# Patient Record
Sex: Female | Born: 1980 | Race: Black or African American | Hispanic: No | Marital: Single | Smoking: Never smoker
Health system: Southern US, Community
[De-identification: ages and names within clinical notes are randomized; demographics above are authoritative.]

---

## 2011-03-14 ENCOUNTER — Encounter: Payer: Self-pay | Admitting: *Deleted

## 2011-03-14 ENCOUNTER — Emergency Department (HOSPITAL_BASED_OUTPATIENT_CLINIC_OR_DEPARTMENT_OTHER)
Admission: EM | Admit: 2011-03-14 | Discharge: 2011-03-14 | Disposition: A | Discharge status: Home / Self Care | Payer: Self-pay | Attending: Emergency Medicine | Admitting: Emergency Medicine

## 2011-03-14 DIAGNOSIS — L089 Local infection of the skin and subcutaneous tissue, unspecified: Secondary | ICD-10-CM | POA: Insufficient documentation

## 2011-03-14 MED ORDER — HYDROCODONE-ACETAMINOPHEN 5-325 MG PO TABS: 2.0000 | ORAL_TABLET | ORAL | Status: AC | PRN

## 2011-03-14 MED ORDER — HYDROCODONE-ACETAMINOPHEN 5-325 MG PO TABS: 2.0000 | ORAL_TABLET | ORAL | Status: DC | PRN

## 2011-03-14 MED ORDER — SULFAMETHOXAZOLE-TRIMETHOPRIM 800-160 MG PO TABS: 1.0000 | ORAL_TABLET | Freq: Two times a day (BID) | ORAL | Status: DC

## 2011-03-14 MED ORDER — SULFAMETHOXAZOLE-TRIMETHOPRIM 800-160 MG PO TABS: 1.0000 | ORAL_TABLET | Freq: Two times a day (BID) | ORAL | Status: AC

## 2011-03-14 NOTE — ED Notes (Signed)
Pt states she has had recurrent boils appear under her left arm

## 2011-03-14 NOTE — ED Provider Notes (Signed)
History     CSN: [id number] Arrival date & time: 03/14/2011  6:23 PM  Chief Complaint  Patient presents with  . Recurrent Skin Infections   Patient is a 30 y.o. female presenting with abscess. The history is provided by the patient. No language interpreter was used.  Abscess  This is a new problem. The current episode started today. The onset was gradual. The problem occurs rarely. The abscess is present on the left arm. The problem is moderate. The abscess is characterized by itchiness and redness. The abscess first occurred at home. Pertinent negatives include no fever.  Pt complains of swollen painful areas under left axilla History reviewed. No pertinent past medical history.  History reviewed. No pertinent past surgical history.  History reviewed. No pertinent family history.  History  Substance Use Topics  . Smoking status: Never Smoker   . Smokeless tobacco: Not on file  . Alcohol Use: Yes    OB History    Grav Para Term Preterm Abortions TAB SAB Ect Mult Living                  Review of Systems  Constitutional: Negative for fever.  All other systems reviewed and are negative.    Physical Exam  BP 131/77  Pulse 80  Temp(Src) 100.7 F (38.2 C) (Oral)  Resp 18  Ht 5' (1.524 m)  Wt 126 lb (57.153 kg)  BMI 24.61 kg/m2  SpO2 99%  Physical Exam  Nursing note and vitals reviewed. Constitutional: She appears well-developed.  HENT:  Head: Normocephalic.  Eyes: Pupils are equal, round, and reactive to light.  Neck: Normal range of motion.  Cardiovascular: Normal rate.   Pulmonary/Chest: Effort normal.  Abdominal: Soft.  Musculoskeletal: Normal range of motion.  Neurological: She is alert.  Skin: Rash noted. There is erythema.    ED Course  Procedures  MDM Tender areas left axilla,  (probable mixture of lymph nodes and abscessed areas,  I will treat with antibiotics,      Langston Masker, [place] 03/14/11 1933

## 2011-03-15 NOTE — ED Provider Notes (Signed)
Medical screening examination/treatment/procedure(s) were performed by non-physician practitioner and as supervising physician I was immediately available for consultation/collaboration.   Leigh-Ann Maxine Huynh, MD 03/15/11 0144 

## 2013-06-21 ENCOUNTER — Emergency Department (HOSPITAL_BASED_OUTPATIENT_CLINIC_OR_DEPARTMENT_OTHER)
Admission: EM | Admit: 2013-06-21 | Discharge: 2013-06-22 | Disposition: A | Discharge status: Home / Self Care | Payer: No Typology Code available for payment source | Attending: Emergency Medicine | Admitting: Emergency Medicine

## 2013-06-21 ENCOUNTER — Encounter (HOSPITAL_BASED_OUTPATIENT_CLINIC_OR_DEPARTMENT_OTHER): Payer: Self-pay | Admitting: Emergency Medicine

## 2013-06-21 DIAGNOSIS — R1110 Vomiting, unspecified: Secondary | ICD-10-CM | POA: Insufficient documentation

## 2013-06-21 DIAGNOSIS — R197 Diarrhea, unspecified: Secondary | ICD-10-CM | POA: Insufficient documentation

## 2013-06-21 DIAGNOSIS — Z3202 Encounter for pregnancy test, result negative: Secondary | ICD-10-CM | POA: Insufficient documentation

## 2013-06-21 DIAGNOSIS — N390 Urinary tract infection, site not specified: Secondary | ICD-10-CM | POA: Insufficient documentation

## 2013-06-21 NOTE — ED Notes (Signed)
Vomiting onset this pm 

## 2013-06-22 LAB — URINALYSIS, ROUTINE W REFLEX MICROSCOPIC
Nitrite: NEGATIVE
Protein, ur: NEGATIVE mg/dL
Specific Gravity, Urine: 1.03 (ref 1.005–1.030)
Urobilinogen, UA: 0.2 mg/dL (ref 0.0–1.0)

## 2013-06-22 LAB — URINE MICROSCOPIC-ADD ON

## 2013-06-22 LAB — PREGNANCY, URINE: Preg Test, Ur: NEGATIVE

## 2013-06-22 MED ORDER — NITROFURANTOIN MONOHYD MACRO 100 MG PO CAPS: 100.0000 mg | ORAL_CAPSULE | Freq: Two times a day (BID) | ORAL | Status: AC

## 2013-06-22 MED ORDER — DICYCLOMINE HCL 10 MG/ML IM SOLN
20.0000 mg | Freq: Once | INTRAMUSCULAR | Status: AC
  Administered 2013-06-22: 20 mg via INTRAMUSCULAR
  Filled 2013-06-22: qty 2

## 2013-06-22 MED ORDER — ONDANSETRON 4 MG PO TBDP
4.0000 mg | ORAL_TABLET | Freq: Once | ORAL | Status: AC
  Administered 2013-06-22: 4 mg via ORAL
  Filled 2013-06-22: qty 1

## 2013-06-22 MED ORDER — KETOROLAC TROMETHAMINE 60 MG/2ML IM SOLN
60.0000 mg | Freq: Once | INTRAMUSCULAR | Status: AC
  Administered 2013-06-22: 60 mg via INTRAMUSCULAR
  Filled 2013-06-22: qty 2

## 2013-06-22 MED ORDER — ONDANSETRON 8 MG PO TBDP: ORAL_TABLET | ORAL | Status: AC

## 2013-06-22 NOTE — ED Provider Notes (Signed)
CSN: [id number]     Arrival date & time 06/21/13  2344 History   First MD Initiated Contact with Patient 06/21/13 2353     Chief Complaint  Patient presents with  . Emesis   (Consider location/radiation/quality/duration/timing/severity/associated sxs/prior Treatment) Patient is a 32 y.o. female presenting with vomiting. The history is provided by the patient.  Emesis Severity:  Moderate Timing:  Constant Quality:  Stomach contents Progression:  Unchanged Chronicity:  New Recent urination:  Increased Context: not post-tussive   Relieved by:  Nothing Worsened by:  Nothing tried Ineffective treatments:  None tried Associated symptoms: diarrhea   Associated symptoms: no abdominal pain   Risk factors: sick contacts     History reviewed. No pertinent past medical history. History reviewed. No pertinent past surgical history. No family history on file. History  Substance Use Topics  . Smoking status: Never Smoker   . Smokeless tobacco: Not on file  . Alcohol Use: Yes   OB History   Grav Para Term Preterm Abortions TAB SAB Ect Mult Living                 Review of Systems  Constitutional: Negative for fever.  Gastrointestinal: Positive for vomiting and diarrhea. Negative for abdominal pain.  All other systems reviewed and are negative.    Allergies  Flagyl  Home Medications   Current Outpatient Rx  Name  Route  Sig  Dispense  Refill  . Multiple Vitamins-Minerals (MULTIVITAMIN WITH MINERALS) tablet   Oral   Take 1 tablet by mouth daily.            BP 116/70  Pulse 110  Temp(Src) 99.7 F (37.6 C) (Oral)  Resp 18  Ht 5' (1.524 m)  Wt 134 lb (60.782 kg)  BMI 26.17 kg/m2  SpO2 99% Physical Exam  Constitutional: She is oriented to person, place, and time. She appears well-developed and well-nourished. No distress.  HENT:  Head: Normocephalic and atraumatic.  Mouth/Throat: Oropharynx is clear and moist. No oropharyngeal exudate.  Eyes: Conjunctivae are  normal. Pupils are equal, round, and reactive to light.  Neck: Normal range of motion. Neck supple.  Cardiovascular: Normal rate, regular rhythm and intact distal pulses.   Pulmonary/Chest: Effort normal and breath sounds normal. She has no wheezes. She has no rales.  Abdominal: Soft. Bowel sounds are increased. There is no tenderness. There is no rebound and no guarding.  Musculoskeletal: Normal range of motion.  Neurological: She is alert and oriented to person, place, and time.  Skin: Skin is warm and dry.  Psychiatric: She has a normal mood and affect.    ED Course  Procedures (including critical care time) Labs Review Labs Reviewed  URINALYSIS, ROUTINE W REFLEX MICROSCOPIC - Abnormal; Notable for the following:    Bilirubin Urine SMALL (*)    Ketones, ur 15 (*)    Leukocytes, UA TRACE (*)    All other components within normal limits  URINE MICROSCOPIC-ADD ON - Abnormal; Notable for the following:    Squamous Epithelial / LPF FEW (*)    Bacteria, UA MANY (*)    All other components within normal limits  URINE CULTURE  PREGNANCY, URINE   Imaging Review No results found.  EKG Interpretation   None       MDM  No diagnosis found. uti- as frequency has been going on for a week and likely viral n/v/d.  Has been exposed to sick people.  Exam and vitals are benign and reassuring.  No indication  for labs or imaging at this time.  Will treat strict return precautions given.      Jasmine Awe, MD 06/22/13 [phone]

## 2013-06-23 LAB — URINE CULTURE: Culture: NO GROWTH

## 2013-10-03 ENCOUNTER — Emergency Department (HOSPITAL_BASED_OUTPATIENT_CLINIC_OR_DEPARTMENT_OTHER)
Admission: EM | Admit: 2013-10-03 | Discharge: 2013-10-03 | Disposition: A | Discharge status: Home / Self Care | Payer: No Typology Code available for payment source | Attending: Emergency Medicine | Admitting: Emergency Medicine

## 2013-10-03 ENCOUNTER — Encounter (HOSPITAL_BASED_OUTPATIENT_CLINIC_OR_DEPARTMENT_OTHER): Payer: Self-pay | Admitting: Emergency Medicine

## 2013-10-03 DIAGNOSIS — R0602 Shortness of breath: Secondary | ICD-10-CM

## 2013-10-03 DIAGNOSIS — K219 Gastro-esophageal reflux disease without esophagitis: Secondary | ICD-10-CM

## 2013-10-03 DIAGNOSIS — Z79899 Other long term (current) drug therapy: Secondary | ICD-10-CM | POA: Insufficient documentation

## 2013-10-03 MED ORDER — RANITIDINE HCL 150 MG PO TABS: 150.0000 mg | ORAL_TABLET | Freq: Two times a day (BID) | ORAL | Status: AC

## 2013-10-03 NOTE — ED Provider Notes (Signed)
CSN: [id number]     Arrival date & time 10/03/13  2018 History  This chart was scribed for Gwyneth Sprout, MD by Charline Bills, ED Scribe. The patient was seen in room MH06/MH06. Patient's care was started at 10:31 PM.    Chief Complaint  Patient presents with  . Shortness of Breath    The history is provided by the patient. No language interpreter was used.   HPI Comments: Kelly Zamora is a 33 y.o. female who presents to the Emergency Department complaining of SOB while sleeping onset 2 months ago. She states that these episodes wake her from her sleep. She reports a similar episode onset earlier today while she was awake after eating and then lying down. She describes the feeling as "a blocked airway". Pt reports sitting in an upright position improves her symptoms. She also reports frequent heartburn. She denies any acidic tastes in her mouth. Pt does not have a history of asthma. LNMP 09/14/13.   No past medical history on file. Past Surgical History  Procedure Laterality Date  . Cesarean section     No family history on file. History  Substance Use Topics  . Smoking status: Never Smoker   . Smokeless tobacco: Not on file  . Alcohol Use: Yes   OB History   Grav Para Term Preterm Abortions TAB SAB Ect Mult Living                 Review of Systems  Constitutional: Negative for fever.  Respiratory: Positive for shortness of breath.   Gastrointestinal: Negative for nausea and vomiting.  All other systems reviewed and are negative.     Allergies  Flagyl  Home Medications   Current Outpatient Rx  Name  Route  Sig  Dispense  Refill  . Multiple Vitamins-Minerals (MULTIVITAMIN WITH MINERALS) tablet   Oral   Take 1 tablet by mouth daily.           . nitrofurantoin, macrocrystal-monohydrate, (MACROBID) 100 MG capsule   Oral   Take 1 capsule (100 mg total) by mouth 2 (two) times daily. X 7 days   14 capsule   0   . ondansetron (ZOFRAN ODT) 8 MG disintegrating  tablet      8mg  ODT q8 hours prn nausea   12 tablet   0    Triage Vitals: BP 114/70  Pulse 79  Temp(Src) 98.5 F (36.9 C) (Oral)  Resp 18  Ht 5' (1.524 m)  Wt 134 lb (60.782 kg)  BMI 26.17 kg/m2  SpO2 100%  LMP 09/14/2013 Physical Exam  Nursing note and vitals reviewed. Constitutional: She is oriented to person, place, and time. She appears well-developed and well-nourished. No distress.  HENT:  Head: Normocephalic.  Mildly enlarge tonsils but non-obstructing  Eyes: Conjunctivae and EOM are normal. Pupils are equal, round, and reactive to light.  Neck: Neck supple.  Cardiovascular: Normal rate, regular rhythm, normal heart sounds and intact distal pulses.   Pulmonary/Chest: Effort normal and breath sounds normal. No respiratory distress. She has no wheezes. She has no rales. She exhibits no tenderness.  Abdominal: Soft. She exhibits no distension. There is no tenderness. There is no rebound and no guarding.  Neurological: She is alert and oriented to person, place, and time.  Skin: Skin is warm. She is not diaphoretic.  Psychiatric: She has a normal mood and affect. Her behavior is normal.    ED Course  Procedures (including critical care time) DIAGNOSTIC STUDIES: Oxygen Saturation is 100% on  RA, normal by my interpretation.    COORDINATION OF CARE: 10:39 PM-Discussed treatment plan with pt at bedside and pt agreed to plan.   Labs Review Labs Reviewed - No data to display Imaging Review No results found.   EKG Interpretation None      MDM   Final diagnoses:  SOB (shortness of breath)  GERD (gastroesophageal reflux disease)   Patient presents with 2 months of intermittent episodes of difficulty breathing while lying when she is asleep. She had seen her doctor and have a followup sleep study to rule out sleep apnea however after eating a meal today and lying down immediately after she developed the same symptoms while awake. She had been lying down approximately  one hour before it occurred. It resolves as soon she sat up. She does admit to getting frequent heartburn but takes nothing for it.  Patient currently has no complaints sating 100% on room air has normal vital signs. She denies a history of smoking, asthma or other medical issues. Patient is not obese and does have mild hypertrophied tonsils but nothing that appears to be obstructing her airway. Feel patient's symptoms are most likely a result of reflux. Will start her on Zantac and she will follow up with her doctor for a possible sleep study in the future.   I personally performed the services described in this documentation, which was scribed in my presence.  The recorded information has been reviewed and considered.   Gwyneth SproutWhitney Ronnett Pullin, MD 10/03/13 [phone]

## 2013-10-03 NOTE — ED Notes (Signed)
Pt sts difficulty breathing while sleeping at night x 2months; had appt with MD for sleep apnea study, but unable to make study appt. Had episode while awake today, while lying on bed. Pt concerned. Pt alert oriented x 4, no breathing difficulty in triage.

## 2013-10-03 NOTE — ED Notes (Signed)
MD at bedside. 

## 2013-10-03 NOTE — Discharge Instructions (Signed)
Diet for Gastroesophageal Reflux Disease, Adult  Reflux is when stomach acid flows up into the esophagus. The esophagus becomes irritated and sore (inflammation). When reflux happens often and is severe, it is called gastroesophageal reflux disease (GERD). What you eat can help ease any discomfort caused by GERD.  FOODS OR DRINKS TO AVOID OR LIMIT   Coffee and black tea, with or without caffeine.   Bubbly (carbonated) drinks with caffeine or energy drinks.   Strong spices, such as pepper, cayenne pepper, curry, or chili powder.   Peppermint or spearmint.   Chocolate.   High-fat foods, such as meats, fried food, oils, butter, or nuts.   Fruits and vegetables that cause discomfort. This includes citrus fruits and tomatoes.   Alcohol.  If a certain food or drink irritates your GERD, avoid eating or drinking it.  THINGS THAT MAY HELP GERD INCLUDE:   Eat meals slowly.   Eat 5 to 6 small meals a day, not 3 large meals.   Do not eat food for a certain amount of time if it causes discomfort.   Wait 3 hours after eating before lying down.   Keep the head of your bed raised 6 to 9 inches (15 23 centimeters). Put a foam wedge or blocks under the legs of the bed.   Stay active. Weight loss, if needed, may help ease your discomfort.   Wear loose-fitting clothing.   Do not smoke or chew tobacco.  Document Released: 12/21/2011 Document Reviewed: 12/21/2011  [employer] Patient Information 2014 [employer].

## 2021-10-22 ENCOUNTER — Emergency Department (HOSPITAL_BASED_OUTPATIENT_CLINIC_OR_DEPARTMENT_OTHER): Payer: No Typology Code available for payment source

## 2021-10-22 ENCOUNTER — Emergency Department (HOSPITAL_BASED_OUTPATIENT_CLINIC_OR_DEPARTMENT_OTHER)
Admission: EM | Admit: 2021-10-22 | Discharge: 2021-10-22 | Disposition: A | Discharge status: Home / Self Care | Payer: No Typology Code available for payment source | Attending: Emergency Medicine | Admitting: Emergency Medicine

## 2021-10-22 ENCOUNTER — Other Ambulatory Visit: Payer: Self-pay

## 2021-10-22 ENCOUNTER — Encounter (HOSPITAL_BASED_OUTPATIENT_CLINIC_OR_DEPARTMENT_OTHER): Payer: Self-pay | Admitting: Emergency Medicine

## 2021-10-22 DIAGNOSIS — R0602 Shortness of breath: Secondary | ICD-10-CM | POA: Diagnosis not present

## 2021-10-22 DIAGNOSIS — D649 Anemia, unspecified: Secondary | ICD-10-CM | POA: Diagnosis not present

## 2021-10-22 DIAGNOSIS — R202 Paresthesia of skin: Secondary | ICD-10-CM | POA: Diagnosis present

## 2021-10-22 DIAGNOSIS — D72819 Decreased white blood cell count, unspecified: Secondary | ICD-10-CM | POA: Diagnosis not present

## 2021-10-22 LAB — COMPREHENSIVE METABOLIC PANEL
ALT: 14 U/L (ref 0–44)
AST: 21 U/L (ref 15–41)
Albumin: 4 g/dL (ref 3.5–5.0)
Alkaline Phosphatase: 75 U/L (ref 38–126)
Anion gap: 8 (ref 5–15)
BUN: 15 mg/dL (ref 6–20)
CO2: 23 mmol/L (ref 22–32)
Calcium: 8.9 mg/dL (ref 8.9–10.3)
Chloride: 105 mmol/L (ref 98–111)
Creatinine, Ser: 0.6 mg/dL (ref 0.44–1.00)
GFR, Estimated: >60 mL/min (ref >60)
Glucose, Bld: 128 mg/dL — ABNORMAL HIGH (ref 70–99)
Potassium: 3.8 mmol/L (ref 3.5–5.1)
Sodium: 136 mmol/L (ref 135–145)
Total Bilirubin: 0.4 mg/dL (ref 0.3–1.2)
Total Protein: 8.1 g/dL (ref 6.5–8.1)

## 2021-10-22 LAB — CBC WITH DIFFERENTIAL/PLATELET
Abs Immature Granulocytes: 0.01 10*3/uL (ref 0.00–0.07)
Basophils Absolute: 0 10*3/uL (ref 0.0–0.1)
Basophils Relative: 1 %
Eosinophils Absolute: 0.2 10*3/uL (ref 0.0–0.5)
Eosinophils Relative: 5 %
HCT: 34.1 % — ABNORMAL LOW (ref 36.0–46.0)
Hemoglobin: 11.9 g/dL — ABNORMAL LOW (ref 12.0–15.0)
Immature Granulocytes: 0 %
Lymphocytes Relative: 36 %
Lymphs Abs: 1.3 10*3/uL (ref 0.7–4.0)
MCH: 32.6 pg (ref 26.0–34.0)
MCHC: 34.9 g/dL (ref 30.0–36.0)
MCV: 93.4 fL (ref 80.0–100.0)
Monocytes Absolute: 0.5 10*3/uL (ref 0.1–1.0)
Monocytes Relative: 13 %
Neutro Abs: 1.6 10*3/uL — ABNORMAL LOW (ref 1.7–7.7)
Neutrophils Relative %: 45 %
Platelets: 170 10*3/uL (ref 150–400)
RBC: 3.65 MIL/uL — ABNORMAL LOW (ref 3.87–5.11)
RDW: 12.3 % (ref 11.5–15.5)
WBC: 3.6 10*3/uL — ABNORMAL LOW (ref 4.0–10.5)
nRBC: 0 % (ref 0.0–0.2)

## 2021-10-22 LAB — TSH: TSH: 1.516 u[IU]/mL (ref 0.350–4.500)

## 2021-10-22 LAB — CBG MONITORING, ED: Glucose-Capillary: 119 mg/dL — ABNORMAL HIGH (ref 70–99)

## 2021-10-22 NOTE — Discharge Instructions (Addendum)
Please read and follow all provided instructions. ? ?Your diagnoses today include:  ?1. Paresthesia   ?2. Shortness of breath   ? ? ?Tests performed today include: ?CT scan of your head: Did not show any problems today ?Blood counts and electrolytes: No significant problems seen ?Thyroid testing: Pending results, please have your doctor follow this up. ?Vital signs. See below for your results today.  ? ?Medications prescribed:  ?None ? ?Take any prescribed medications only as directed. ? ?Home care instructions:  ?Follow any educational materials contained in this packet. ? ?BE VERY CAREFUL not to take multiple medicines containing Tylenol (also called acetaminophen). Doing so can lead to an overdose which can damage your liver and cause liver failure and possibly death.  ? ?Follow-up instructions: ?Please follow-up with your primary care provider in the next 7 days for further evaluation of your symptoms.  ? ?Return instructions:  ?Please return to the Emergency Department if you experience worsening symptoms.  ?Return if you have weakness in your arms or legs, slurred speech, trouble walking or talking, confusion, or trouble with your balance.  ?Please return if you have any other emergent concerns. ? ?Additional Information: ? ?Your vital signs today were: ?BP 110/76   Pulse 93   Temp 98.5 ?F (36.9 ?C) (Oral)   Resp 19   Ht 5' (1.524 m)   Wt 66.7 kg   LMP 10/18/2021   SpO2 100%   BMI 28.71 kg/m?  ?If your blood pressure (BP) was elevated above 135/85 this visit, please have this repeated by your doctor within one month. ?-------------- ? ?

## 2021-10-22 NOTE — ED Triage Notes (Signed)
Pt arrives pov, steady gait, with c/o left side "tingling", mainly face, and left arm. Pt last normal at 0830 when lying down to sleep after working last night. Woke up ~ 11am with report of heart racing and right arm tingling. AOx4, bilaterally equal, speech clear. Denies HA. ?

## 2021-10-22 NOTE — ED Provider Notes (Signed)
?[facility] EMERGENCY DEPARTMENT ?Provider Note ? ? ?CSN: [id number] ?Arrival date & time: 10/22/21  1149 ? ?  ? ?History ? ?Chief Complaint  ?Patient presents with  ? Tingling  ? ? ?Kelly Zamora is a 41 y.o. female. ? ?Patient with no significant past medical history presents emergency department today for evaluation of shortness of breath, right hand tingling, and left-sided body tingling and facial tingling.  Patient works overnight.  She returned home from work this morning.  She awoke from sleep at around 11 AM feeling short of breath and feeling palpitations.  She questions as to whether she has a history of sleep apnea because sometimes she will awaken abruptly and feels shortness of breath and go back to sleep.  However today when she awoke she had tingling, pins and needle sensation in her right hand.  Shortly afterwards she developed tingling and numbness in her left face, and left arm and leg.  No associated weakness.  No vision changes.  No difficulty walking or talking.  Symptoms did not resolve after few minutes so she decided to come to the emergency department.  Symptoms are gradually improving now.  She states that she was breathing very rapidly and was very anxious at onset of symptoms.  She does report only getting several hours of sleep per day due to working third shift and also having children at home.  No history of stroke.  She does have a family history of heart disease on her father's side and many second-degree relatives.  She denies chest pain, vomiting, diarrhea. ? ? ?  ? ?Home Medications ?Prior to Admission medications   ?Medication Sig Start Date End Date Taking? Authorizing Provider  ?Multiple Vitamins-Minerals (MULTIVITAMIN WITH MINERALS) tablet Take 1 tablet by mouth daily.      [provider]  ?nitrofurantoin, macrocrystal-monohydrate, (MACROBID) 100 MG capsule Take 1 capsule (100 mg total) by mouth 2 (two) times daily. X 7 days 06/22/13   Palumbo, April, MD   ?ondansetron (ZOFRAN ODT) 8 MG disintegrating tablet 8mg  ODT q8 hours prn nausea 06/22/13   Palumbo, April, MD  ?ranitidine (ZANTAC) 150 MG tablet Take 1 tablet (150 mg total) by mouth 2 (two) times daily. 10/03/13   12/03/13, MD  ?   ? ?Allergies    ?Flagyl [metronidazole hcl]   ? ?Review of Systems   ?Review of Systems ? ?Physical Exam ?Updated Vital Signs ?BP 110/76   Pulse 93   Temp 98.5 ?F (36.9 ?C) (Oral)   Resp 19   Ht 5' (1.524 m)   Wt 66.7 kg   LMP 10/18/2021   SpO2 100%   BMI 28.71 kg/m?  ? ?Physical Exam ?Vitals and nursing note reviewed.  ?Constitutional:   ?   Appearance: She is well-developed. She is not diaphoretic.  ?HENT:  ?   Head: Normocephalic and atraumatic.  ?   Right Ear: Tympanic membrane, ear canal and external ear normal.  ?   Left Ear: Tympanic membrane, ear canal and external ear normal.  ?   Nose: Nose normal.  ?   Mouth/Throat:  ?   Mouth: Mucous membranes are not dry.  ?   Pharynx: Uvula midline.  ?Eyes:  ?   General: Lids are normal.  ?   Extraocular Movements:  ?   Right eye: No nystagmus.  ?   Left eye: No nystagmus.  ?   Conjunctiva/sclera: Conjunctivae normal.  ?   Pupils: Pupils are equal, round, and reactive to light.  ?  Neck:  ?   Vascular: Normal carotid pulses. No JVD.  ?   Trachea: Trachea normal. No tracheal deviation.  ?Cardiovascular:  ?   Rate and Rhythm: Normal rate and regular rhythm.  ?   Pulses: No decreased pulses.     ?     Radial pulses are 2+ on the right side and 2+ on the left side.  ?   Heart sounds: Normal heart sounds, S1 normal and S2 normal. No murmur heard. ?Pulmonary:  ?   Effort: Pulmonary effort is normal. No respiratory distress.  ?   Breath sounds: Normal breath sounds. No wheezing.  ?Chest:  ?   Chest wall: No tenderness.  ?Abdominal:  ?   General: Bowel sounds are normal.  ?   Palpations: Abdomen is soft.  ?   Tenderness: There is no abdominal tenderness. There is no guarding or rebound.  ?Musculoskeletal:     ?   General: Normal  range of motion.  ?   Cervical back: Normal range of motion and neck supple. No tenderness or bony tenderness. No muscular tenderness.  ?Skin: ?   General: Skin is warm and dry.  ?   Coloration: Skin is not pale.  ?Neurological:  ?   Mental Status: She is alert and oriented to person, place, and time.  ?   GCS: GCS eye subscore is 4. GCS verbal subscore is 5. GCS motor subscore is 6.  ?   Cranial Nerves: No cranial nerve deficit.  ?   Sensory: No sensory deficit.  ?   Motor: No weakness.  ?   Coordination: Coordination normal.  ?   Gait: Gait normal.  ?   Comments: Upper extremity myotomes tested bilaterally:  ?C5 Shoulder abduction 5/5 ?C6 Elbow flexion/wrist extension 5/5 ?C7 Elbow extension 5/5 ?C8 Finger flexion 5/5 ?T1 Finger abduction 5/5 ? ?Lower extremity myotomes tested bilaterally: ?L2 Hip flexion 5/5 ?L3 Knee extension 5/5 ?L4 Ankle dorsiflexion 5/5 ?S1 Ankle plantar flexion 5/5 ?  ? ? ?ED Results / Procedures / Treatments   ?Labs ?(all labs ordered are listed, but only abnormal results are displayed) ?Labs Reviewed  ?CBC WITH DIFFERENTIAL/PLATELET - Abnormal; Notable for the following components:  ?    Result Value  ? WBC 3.6 (*)   ? RBC 3.65 (*)   ? Hemoglobin 11.9 (*)   ? HCT 34.1 (*)   ? Neutro Abs 1.6 (*)   ? All other components within normal limits  ?COMPREHENSIVE METABOLIC PANEL - Abnormal; Notable for the following components:  ? Glucose, Bld 128 (*)   ? All other components within normal limits  ?CBG MONITORING, ED - Abnormal; Notable for the following components:  ? Glucose-Capillary 119 (*)   ? All other components within normal limits  ?TSH  ? ? ?EKG ?EKG Interpretation ? ?Date/Time:  Thursday October 22 2021 12:03:28 EDT ?Ventricular Rate:  80 ?PR Interval:  127 ?QRS Duration: 79 ?QT Interval:  355 ?QTC Calculation: 410 ?R Axis:   64 ?Text Interpretation: Sinus rhythm no prior ECG for comparison. No STEMI Confirmed by Theda Belfast ([id number]) on 10/22/2021 1:49:33 PM ? ?Radiology ?CT HEAD WO  CONTRAST ( ) ? ?Result Date: 10/22/2021 ?CLINICAL DATA:  Tingling. EXAM: CT HEAD WITHOUT CONTRAST TECHNIQUE: Contiguous axial images were obtained from the base of the skull through the vertex without intravenous contrast. RADIATION DOSE REDUCTION: This exam was performed according to the departmental dose-optimization program which includes automated exposure control, adjustment of the mA and/or kV according to patient  size and/or use of iterative reconstruction technique. COMPARISON:  None. FINDINGS: Brain: No acute infarct, hemorrhage, or mass lesion is present. No significant white matter lesions are present. The ventricles are of normal size. No significant extraaxial fluid collection is present. The brainstem and cerebellum are within normal limits. Vascular: No hyperdense vessel or unexpected calcification. Skull: Calvarium is intact. No focal lytic or blastic lesions are present. No significant extracranial soft tissue lesion is present. Sinuses/Orbits: The paranasal sinuses and mastoid air cells are clear. The globes and orbits are within normal limits. IMPRESSION: Negative CT of the head. Electronically Signed   By: Marin Robertshristopher  Mattern M.D.   On: 10/22/2021 12:45   ? ?Procedures ?Procedures  ? ? ?Medications Ordered in ED ?Medications - No data to display ? ?ED Course/ Medical Decision Making/ A&P ?  ? ?Patient seen and examined. History obtained directly from patient.  ? ?Labs/EKG: EKG reviewed. No abnormalities. Ordered CBC, BMP, TSH. ? ?Imaging: Ordered CT head without contrast ? ?Medications/Fluids: None ordered ? ?Most recent vital signs reviewed and are as follows: ?BP 110/76   Pulse 93   Temp 98.5 ?F (36.9 ?C) (Oral)   Resp 19   Ht 5' (1.524 m)   Wt 66.7 kg   LMP 10/18/2021   SpO2 100%   BMI 28.71 kg/m?  ? ?Initial impression: Paresthesias, possible anxiety ? ?2:56 PM Reassessment performed. Patient appears more comfortable.  Symptoms resolved. ? ?Labs personally reviewed and interpreted  including: CBC with mild leukopenia and anemia; CMP unremarkable; TSH pending ? ?Imaging personally visualized and interpreted including: CT head agree negative. ? ?Reviewed pertinent lab work and imaging with patient at bedsi

## 2022-07-22 IMAGING — CT CT HEAD W/O CM
3 series · 15 of 47 positions shown, 18 images · non-contrast
Comparison: None.

CLINICAL DATA: Tingling.



[Series 2: head wo · axial · 0.41mm/px · z∈[-700,-565]mm · 9 of 33 slices shown, 12 images]
[im 3/33  brain]
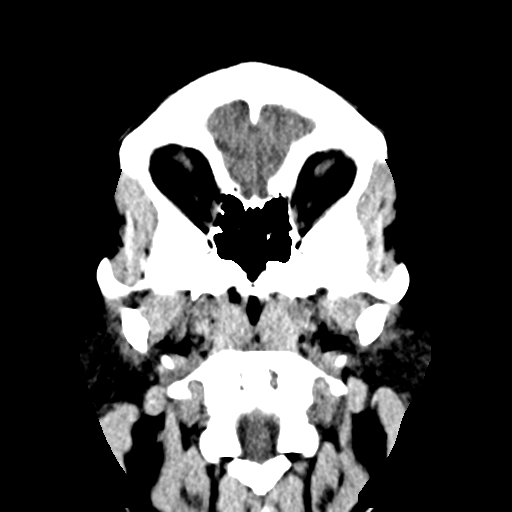
[im 3/33  bone]
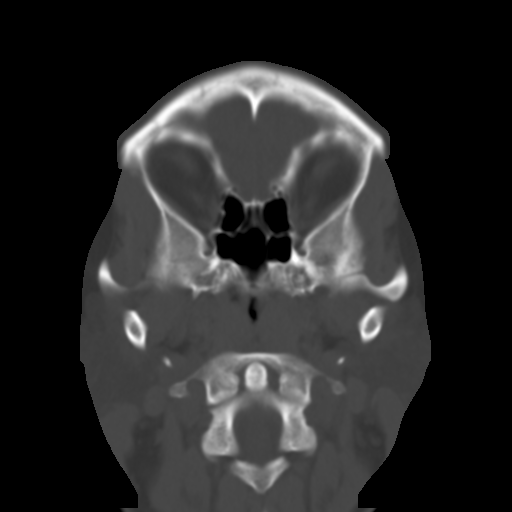
[im 6/33  brain]
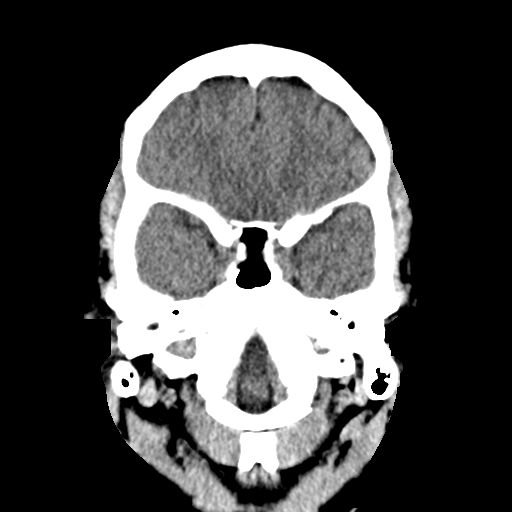
[im 9/33  brain]
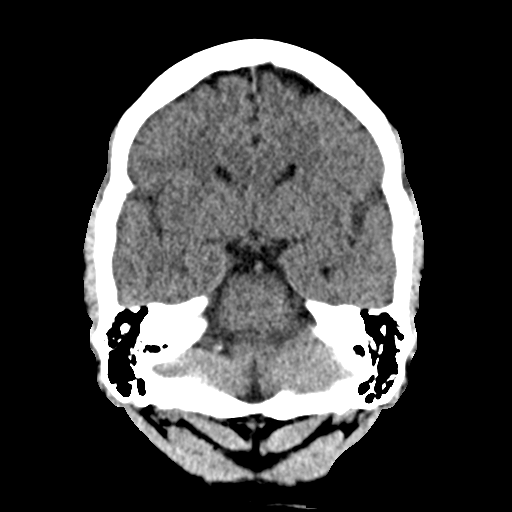
[im 13/33  brain]
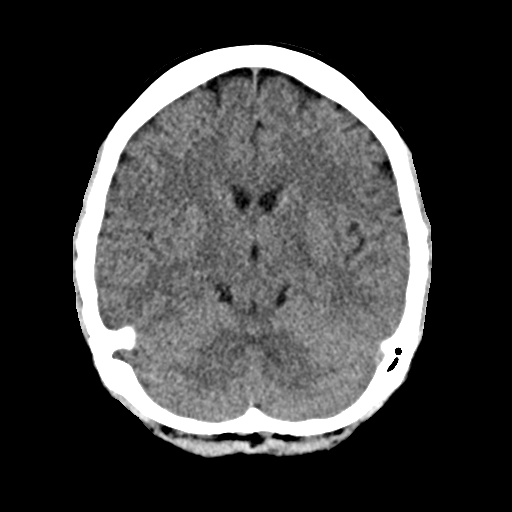
[im 17/33  brain]
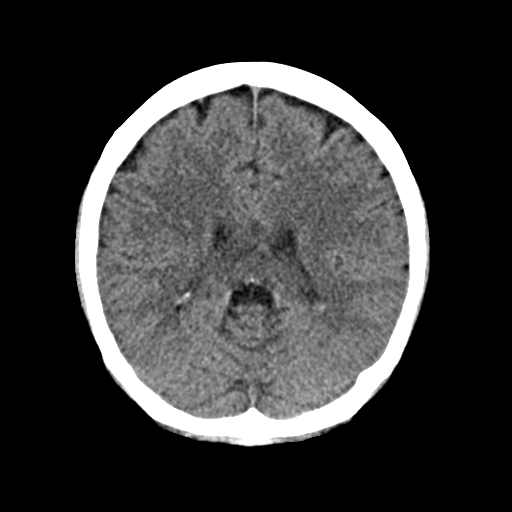
[im 17/33  bone]
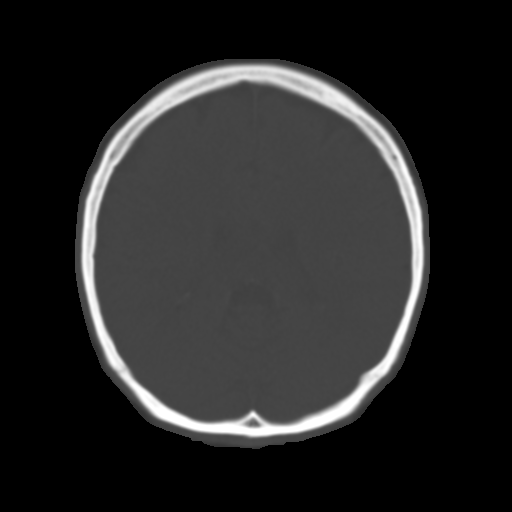
[im 20/33  brain]
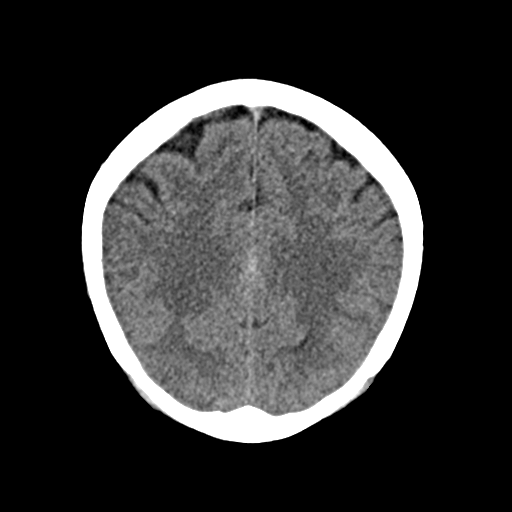
[im 24/33  brain]
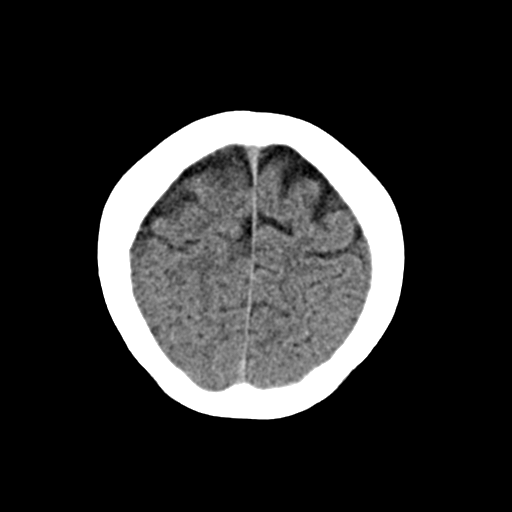
[im 27/33  brain]
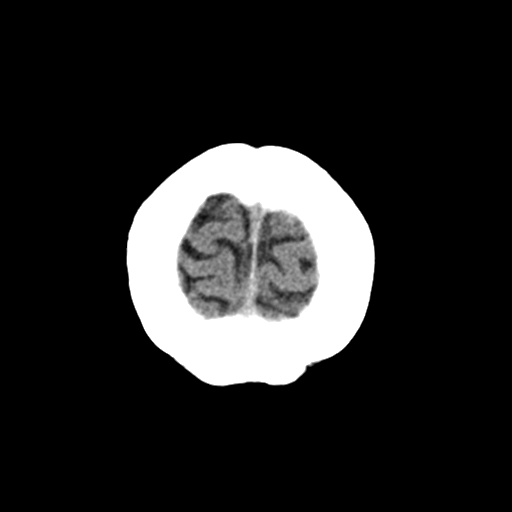
[im 30/33  brain]
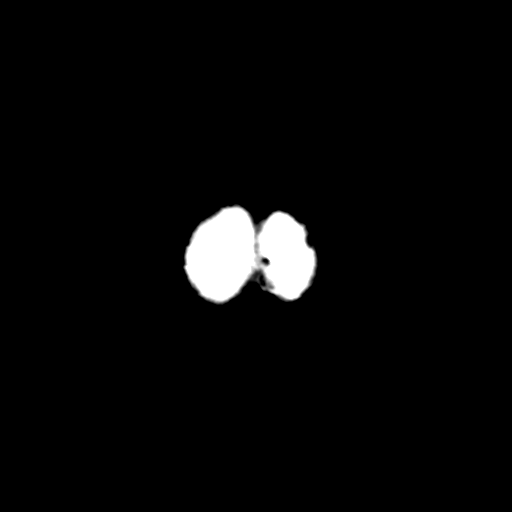
[im 30/33  bone]
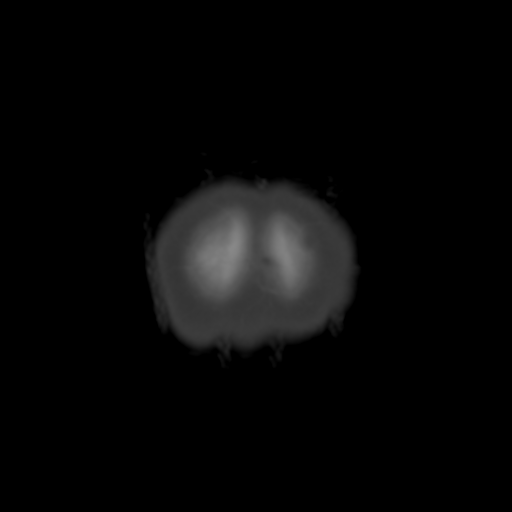

[Series 4: coronal soft · coronal · 0.32mm/px · 3 of 66 slices shown]
[im 22/66  brain]
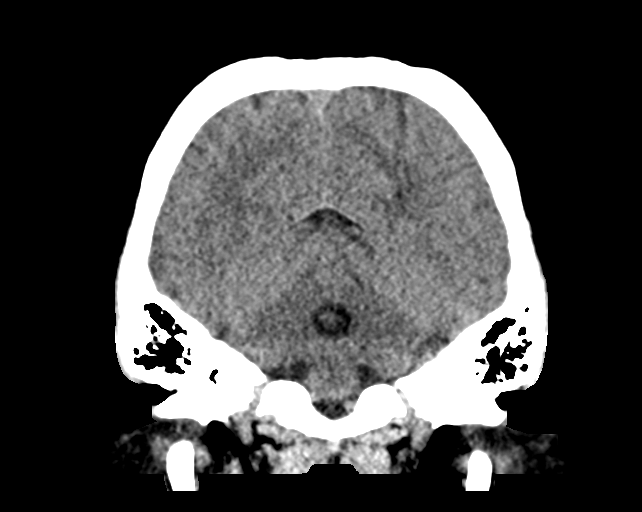
[im 29/66  brain]
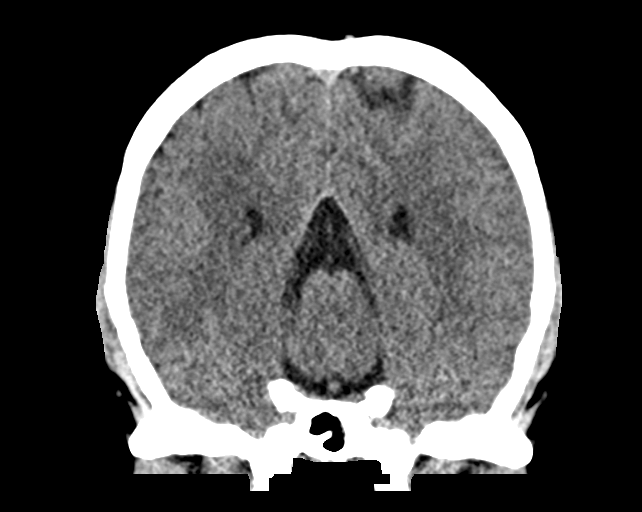
[im 37/66  brain]
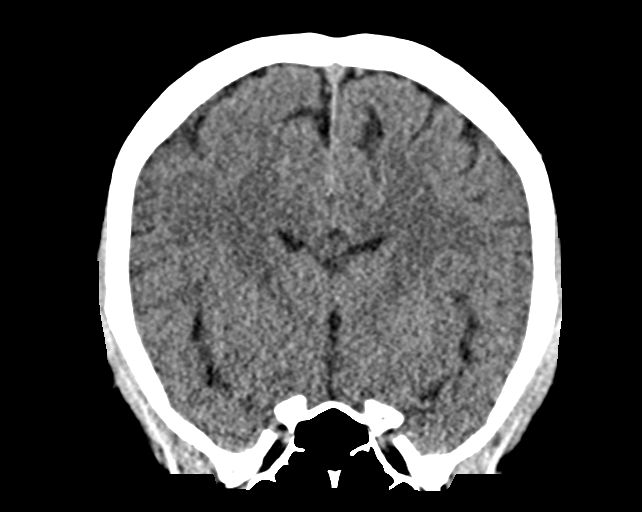

[Series 5: sag soft · sagittal · 0.32mm/px · 3 of 67 slices shown]
[im 23/67  brain]
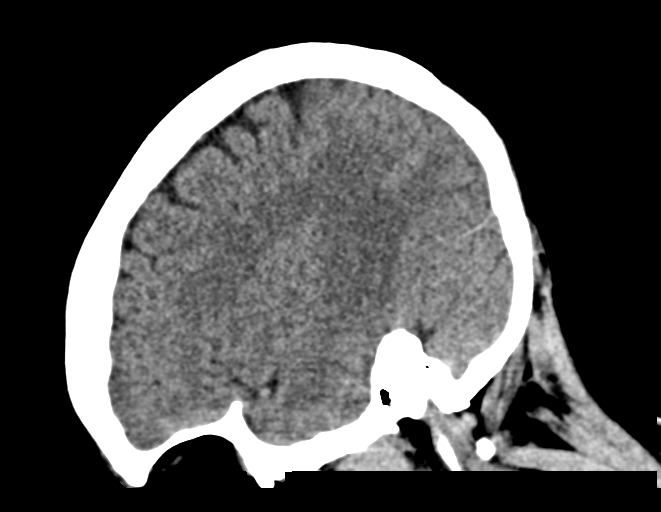
[im 34/67  brain]
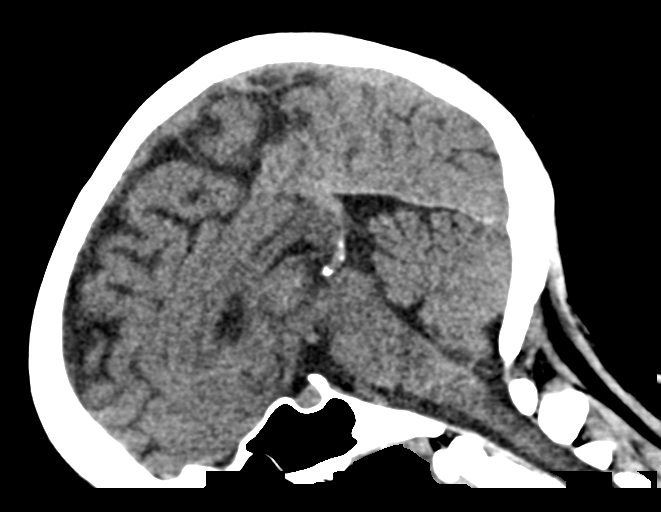
[im 45/67  brain]
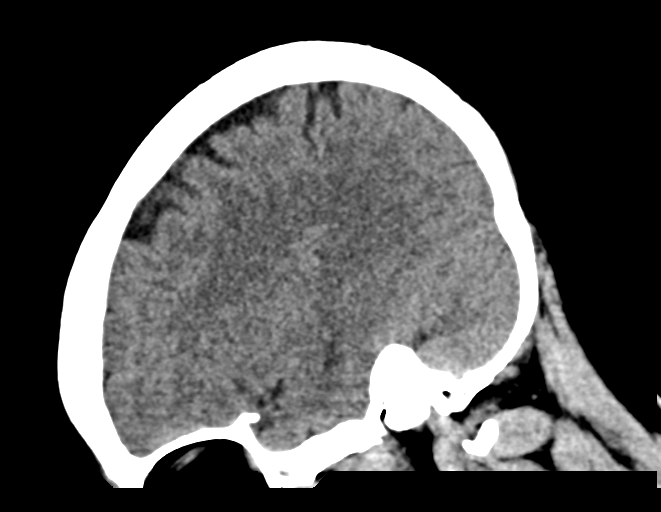

[15 of 47 positions shown; findings below may reference images not displayed]

FINDINGS: Brain: No acute infarct, hemorrhage, or mass lesion is present. No
significant white matter lesions are present. The ventricles are of
normal size. No significant extraaxial fluid collection is present.

The brainstem and cerebellum are within normal limits.

Vascular: No hyperdense vessel or unexpected calcification.

Skull: Calvarium is intact. No focal lytic or blastic lesions are
present. No significant extracranial soft tissue lesion is present.

Sinuses/Orbits: The paranasal sinuses and mastoid air cells are
clear. The globes and orbits are within normal limits.
IMPRESSION: Negative CT of the head.
